# Patient Record
Sex: Male | Born: 1970
Health system: Southern US, Community
[De-identification: ages and names within clinical notes are randomized; demographics above are authoritative.]

---

## 1997-07-11 ENCOUNTER — Ambulatory Visit (HOSPITAL_COMMUNITY): Admission: RE | Admit: 1997-07-11 | Discharge: 1997-07-11 | Payer: Self-pay | Admitting: Family Medicine

## 1997-07-27 ENCOUNTER — Ambulatory Visit (HOSPITAL_COMMUNITY): Admission: RE | Admit: 1997-07-27 | Discharge: 1997-07-27 | Payer: Self-pay | Admitting: Family Medicine

## 1997-12-16 ENCOUNTER — Ambulatory Visit (HOSPITAL_COMMUNITY): Admission: RE | Admit: 1997-12-16 | Discharge: 1997-12-16 | Payer: Self-pay | Admitting: Family Medicine

## 1997-12-16 ENCOUNTER — Encounter: Payer: Self-pay | Admitting: Family Medicine

## 1999-02-24 ENCOUNTER — Emergency Department (HOSPITAL_COMMUNITY): Admission: EM | Admit: 1999-02-24 | Discharge: 1999-02-24 | Payer: Self-pay | Admitting: Emergency Medicine

## 1999-02-24 ENCOUNTER — Encounter: Payer: Self-pay | Admitting: Emergency Medicine

## 2000-07-24 ENCOUNTER — Emergency Department (HOSPITAL_COMMUNITY): Admission: EM | Admit: 2000-07-24 | Discharge: 2000-07-24 | Payer: Self-pay | Admitting: Emergency Medicine

## 2007-01-30 ENCOUNTER — Encounter: Admission: RE | Admit: 2007-01-30 | Discharge: 2007-04-30 | Payer: Self-pay | Admitting: Family Medicine

## 2015-10-03 ENCOUNTER — Encounter (HOSPITAL_COMMUNITY): Payer: Self-pay | Admitting: *Deleted

## 2015-10-03 DIAGNOSIS — R071 Chest pain on breathing: Secondary | ICD-10-CM | POA: Diagnosis present

## 2015-10-03 DIAGNOSIS — R0789 Other chest pain: Secondary | ICD-10-CM | POA: Diagnosis not present

## 2015-10-03 NOTE — ED Triage Notes (Signed)
Pt c/o sharp right side non radiating chest pain for two day. Pt states pt was watching TV when pain started, any activity increases pain. Denies any other symptoms.

## 2015-10-04 ENCOUNTER — Emergency Department (HOSPITAL_COMMUNITY): Payer: BLUE CROSS/BLUE SHIELD

## 2015-10-04 ENCOUNTER — Emergency Department (HOSPITAL_COMMUNITY)
Admission: EM | Admit: 2015-10-04 | Discharge: 2015-10-04 | Disposition: A | Discharge status: Home / Self Care | Payer: BLUE CROSS/BLUE SHIELD | Attending: Emergency Medicine | Admitting: Emergency Medicine

## 2015-10-04 DIAGNOSIS — R079 Chest pain, unspecified: Secondary | ICD-10-CM

## 2015-10-04 LAB — BASIC METABOLIC PANEL
ANION GAP: 8 (ref 5–15)
BUN: 9 mg/dL (ref 6–20)
CO2: 26 mmol/L (ref 22–32)
CREATININE: 1.11 mg/dL (ref 0.61–1.24)
Calcium: 9.6 mg/dL (ref 8.9–10.3)
Chloride: 104 mmol/L (ref 101–111)
GFR calc Af Amer: >60 mL/min (ref >60)
GFR calc non Af Amer: >60 mL/min (ref >60)
GLUCOSE: 131 mg/dL — AB (ref 65–99)
POTASSIUM: 3.9 mmol/L (ref 3.5–5.1)
Sodium: 138 mmol/L (ref 135–145)

## 2015-10-04 LAB — CBC
HEMATOCRIT: 45.2 % (ref 39.0–52.0)
Hemoglobin: 15.4 g/dL (ref 13.0–17.0)
MCH: 29.1 pg (ref 26.0–34.0)
MCHC: 34.1 g/dL (ref 30.0–36.0)
MCV: 85.4 fL (ref 78.0–100.0)
PLATELETS: 266 10*3/uL (ref 150–400)
RBC: 5.29 MIL/uL (ref 4.22–5.81)
RDW: 13.1 % (ref 11.5–15.5)
WBC: 8 10*3/uL (ref 4.0–10.5)

## 2015-10-04 LAB — I-STAT TROPONIN, ED: Troponin i, poc: 0 ng/mL (ref 0.00–0.08)

## 2015-10-04 NOTE — ED Notes (Addendum)
Pt reports left sided chest pain that he describes as "under my right breast."  Pt reports that the pain has been constant for 2 days. Pt reports increased pain with inspiration and coughing.  Pt rates his chest pain at a 4/10 at this time.   Chief Complaint  Patient presents with  . Chest Pain   History reviewed. No pertinent past medical history.

## 2015-10-04 NOTE — Discharge Instructions (Signed)
Ibuprofen 600 mg every 6 hours as needed for pain.  Follow-up with cardiology if your symptoms are not improving in the next 2 days. [facility] have been provided in this discharge summary for you to call and make these arrangements.  Return to the emergency department if your symptoms significantly worsen or change.

## 2015-10-04 NOTE — ED Notes (Signed)
Pt provided with d/c instructions at this time. Pt verbalizes understanding of d/c instructions as well as follow up procedure after d/c. No new RX at time of d/c.  Pt in no apparent distress at this time. Pt ambulatory at time of d/c.   

## 2015-10-04 NOTE — ED Provider Notes (Signed)
[facility]-EMERGENCY DEPT Provider Note   CSN: [id number] Arrival date & time: 10/03/15  2341  By signing my name below, I, Clovis Pu, attest that this documentation has been prepared under the direction and in the presence of Geoffery Lyons, MD  Electronically Signed: Clovis Pu, ED Scribe. 10/04/15. 3:33 AM.  History   Chief Complaint Chief Complaint  Patient presents with  . Chest Pain    The history is provided by the patient. No language interpreter was used.  Chest Pain   This is a new problem. The current episode started more than 2 days ago. The problem occurs constantly. The problem has not changed since onset.The pain is associated with movement and coughing. The pain is moderate. The symptoms are aggravated by deep breathing. Pertinent negatives include no shortness of breath. He has tried nothing for the symptoms.   HPI Comments:  Marco Miller is a 45 y.o. male who presents to the Emergency Department complaining of sudden onset , constant right side chest pain which began 2 days ago. Pt notes he was sitting and watching tv when the pain suddenly began. He states the pain is exacerbated when he lays down, upon palpation, when he coughs, sneezes, or takes deep breaths. He denies SOB, any recent strenuous activity, swelling to his BLE, DM, heart problems, or smoking. Pt notes he is active but has not worked out since August. No alleviating factors noted.    History reviewed. No pertinent past medical history.  There are no active problems to display for this patient.   History reviewed. No pertinent surgical history.   Home Medications    Prior to Admission medications   Medication Sig Start Date End Date Taking? Authorizing Provider  fluticasone (FLONASE) 50 MCG/ACT nasal spray Place 1 spray into both nostrils daily as needed for allergies or rhinitis.   Yes Historical Provider, MD  valACYclovir (VALTREX) 500 MG tablet Take 500 mg by mouth 2 (two) times daily as needed  (outbreak).   Yes Historical Provider, MD    Family History History reviewed. No pertinent family history.  Social History Social History  Substance Use Topics  . Smoking status: Never Smoker  . Smokeless tobacco: Never Used  . Alcohol use Yes     Allergies   Review of patient's allergies indicates no known allergies.   Review of Systems Review of Systems  Respiratory: Negative for shortness of breath.   Cardiovascular: Positive for chest pain.  All other systems reviewed and are negative.   Physical Exam Updated Vital Signs BP 118/80   Pulse 63   Temp 97.8 F (36.6 C) (Oral)   Resp 17   Ht 6' (1.829 m)   Wt 215 lb (97.5 kg)   SpO2 100%   BMI 29.16 kg/m   Physical Exam  Constitutional: He is oriented to person, place, and time. He appears well-developed and well-nourished. No distress.  HENT:  Head: Normocephalic and atraumatic.  Right Ear: Hearing normal.  Left Ear: Hearing normal.  Nose: Nose normal.  Mouth/Throat: Oropharynx is clear and moist and mucous membranes are normal.  Eyes: Conjunctivae and EOM are normal. Pupils are equal, round, and reactive to light.  Neck: Normal range of motion. Neck supple.  Cardiovascular: Regular rhythm, S1 normal and S2 normal.  Exam reveals no gallop and no friction rub.   No murmur heard. Pulmonary/Chest: Effort normal and breath sounds normal. No respiratory distress.  TTP over right lower ribs.   Abdominal: Soft. Normal appearance and bowel sounds  are normal. There is no hepatosplenomegaly. There is no tenderness. There is no rebound, no guarding, no tenderness at McBurney's point and negative Murphy's sign. No hernia.  Musculoskeletal: Normal range of motion.  Neurological: He is alert and oriented to person, place, and time. He has normal strength. No cranial nerve deficit or sensory deficit. Coordination normal. GCS eye subscore is 4. GCS verbal subscore is 5. GCS motor subscore is 6.  Skin: Skin is warm, dry and  intact. No rash noted. No cyanosis.  Psychiatric: He has a normal mood and affect. His speech is normal and behavior is normal. Thought content normal.  Nursing note and vitals reviewed.   ED Treatments / Results  DIAGNOSTIC STUDIES:  Oxygen Saturation is 100% on RA, normal by my interpretation.    COORDINATION OF CARE:  3:27 AM Discussed treatment plan with pt at bedside and pt agreed to plan.  Labs (all labs ordered are listed, but only abnormal results are displayed) Labs Reviewed  BASIC METABOLIC PANEL - Abnormal; Notable for the following:       Result Value   Glucose, Bld 131 (*)    All other components within normal limits  CBC  I-STAT TROPOININ, ED    EKG  EKG Interpretation None       Radiology Dg Chest 2 View  Result Date: 10/04/2015 CLINICAL DATA:  Right-sided chest pain for 2 days. Worse with cough. EXAM: CHEST  2 VIEW COMPARISON:  None. FINDINGS: Slightly shallow inspiration. The heart size and mediastinal contours are within normal limits. Both lungs are clear. The visualized skeletal structures are unremarkable. IMPRESSION: No active cardiopulmonary disease. Electronically Signed   By: Burman NievesWilliam  Stevens M.D.   On: 10/04/2015 00:18    Procedures Procedures (including critical care time)  Medications Ordered in ED Medications - No data to display   Initial Impression / Assessment and Plan / ED Course  I have reviewed the triage vital signs and the nursing notes.  Pertinent labs & imaging results that were available during my care of the patient were reviewed by me and considered in my medical decision making (see chart for details).  Clinical Course    Patient presents with a three-day history of chest discomfort that began in the absence of any injury or trauma. There is no associated shortness of breath, nausea, diaphoresis, or radiation. He has no exertional symptoms. His workup reveals no evidence for a cardiac etiology. Troponin is negative,  however EKG does show nonspecific ST segment abnormalities. He has no prior ECGs for comparison. His discomfort is very reproducible with palpation of the right lower anterior chest wall and I highly suspect a musculoskeletal etiology. He will be discharged with anti-inflammatories and when necessary return.  Final Clinical Impressions(s) / ED Diagnoses   Final diagnoses:  None    New Prescriptions New Prescriptions   No medications on file  I personally performed the services described in this documentation, which was scribed in my presence. The recorded information has been reviewed and is accurate.        Geoffery Lyonsouglas Rhett Najera, MD 10/04/15 [phone]

## 2017-10-15 IMAGING — DX DG CHEST 2V
2 series · 2 of 2 positions shown · non-contrast
Comparison: None.

CLINICAL DATA: Right-sided chest pain for 2 days. Worse with cough.

EXAM:
CHEST  2 VIEW

[chest pa]
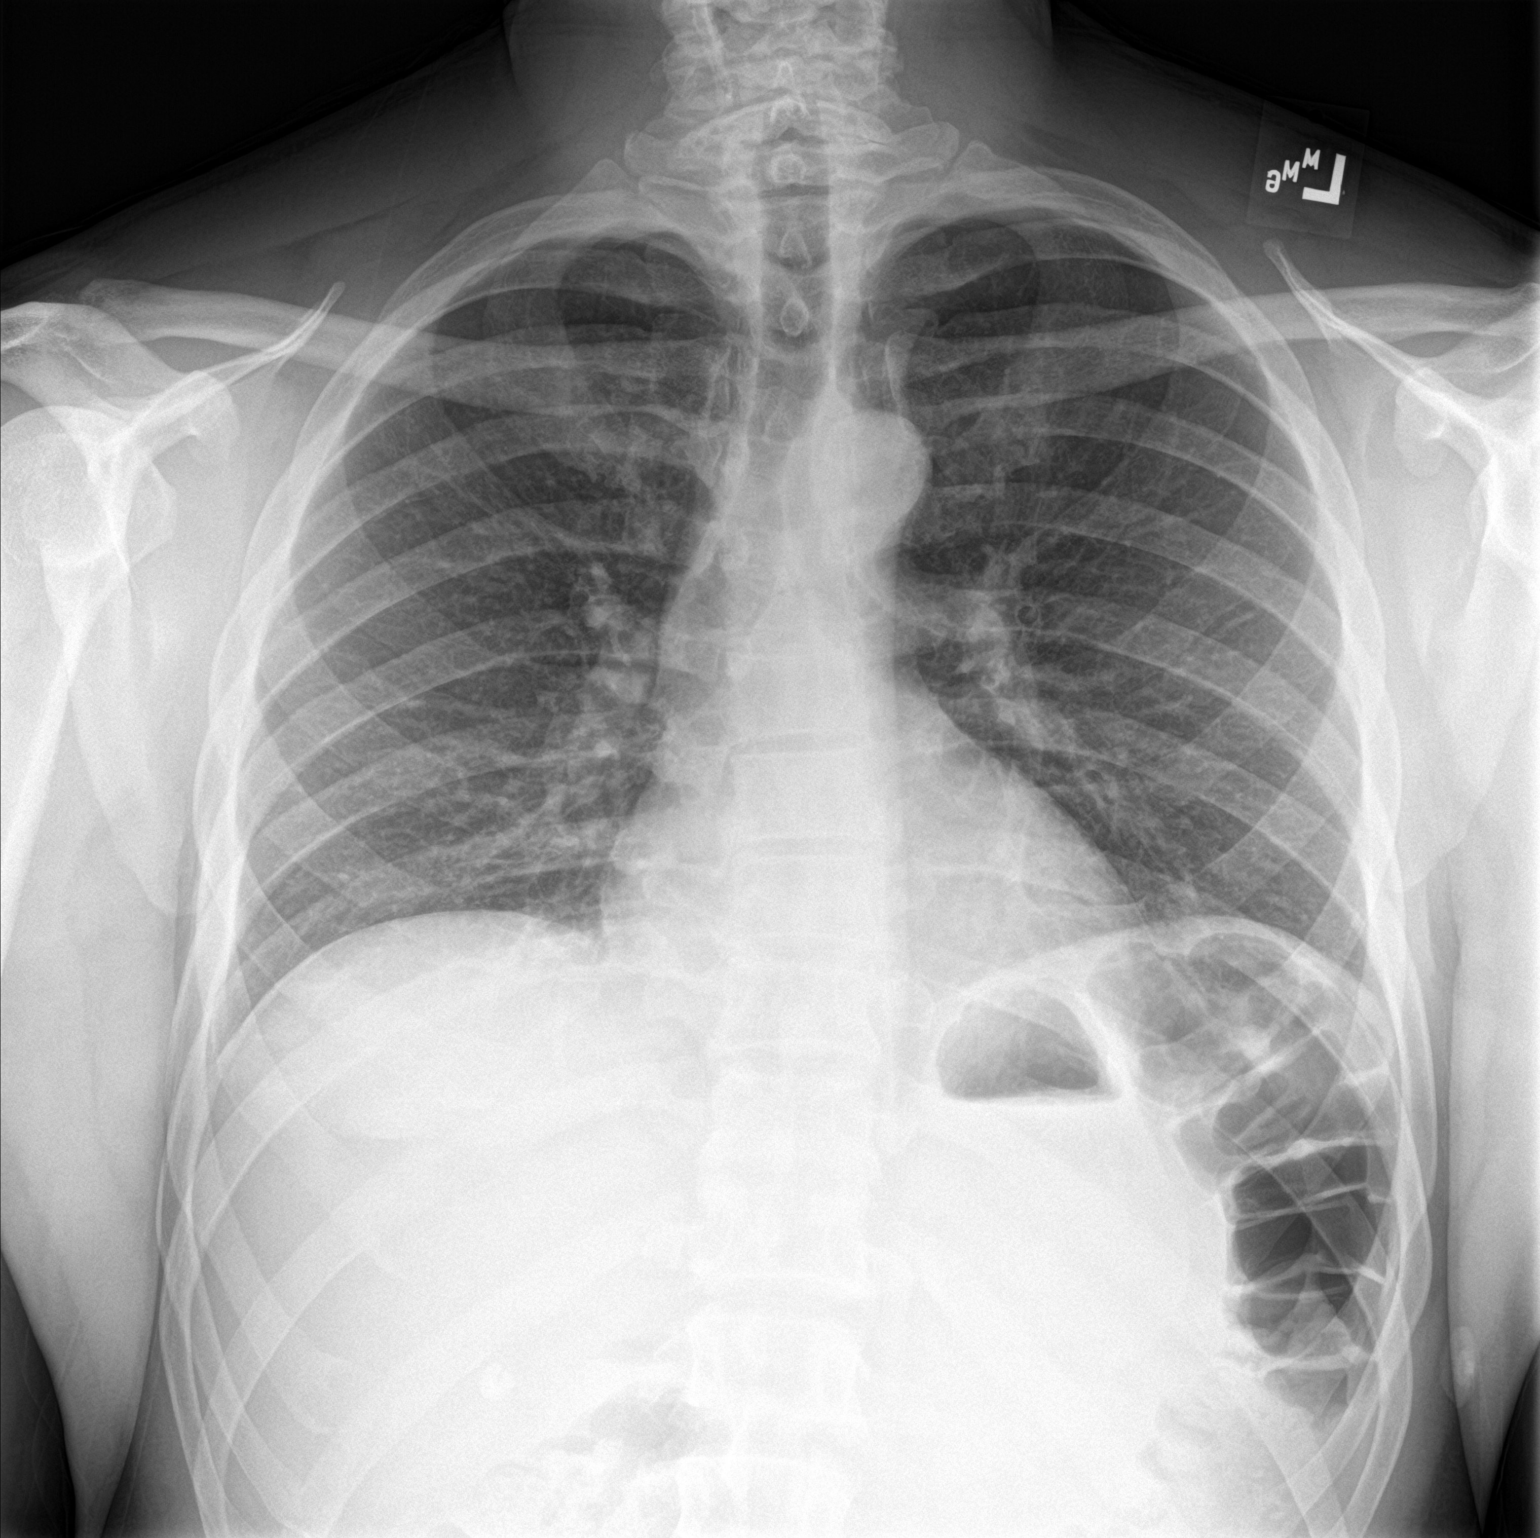

[chest lat]
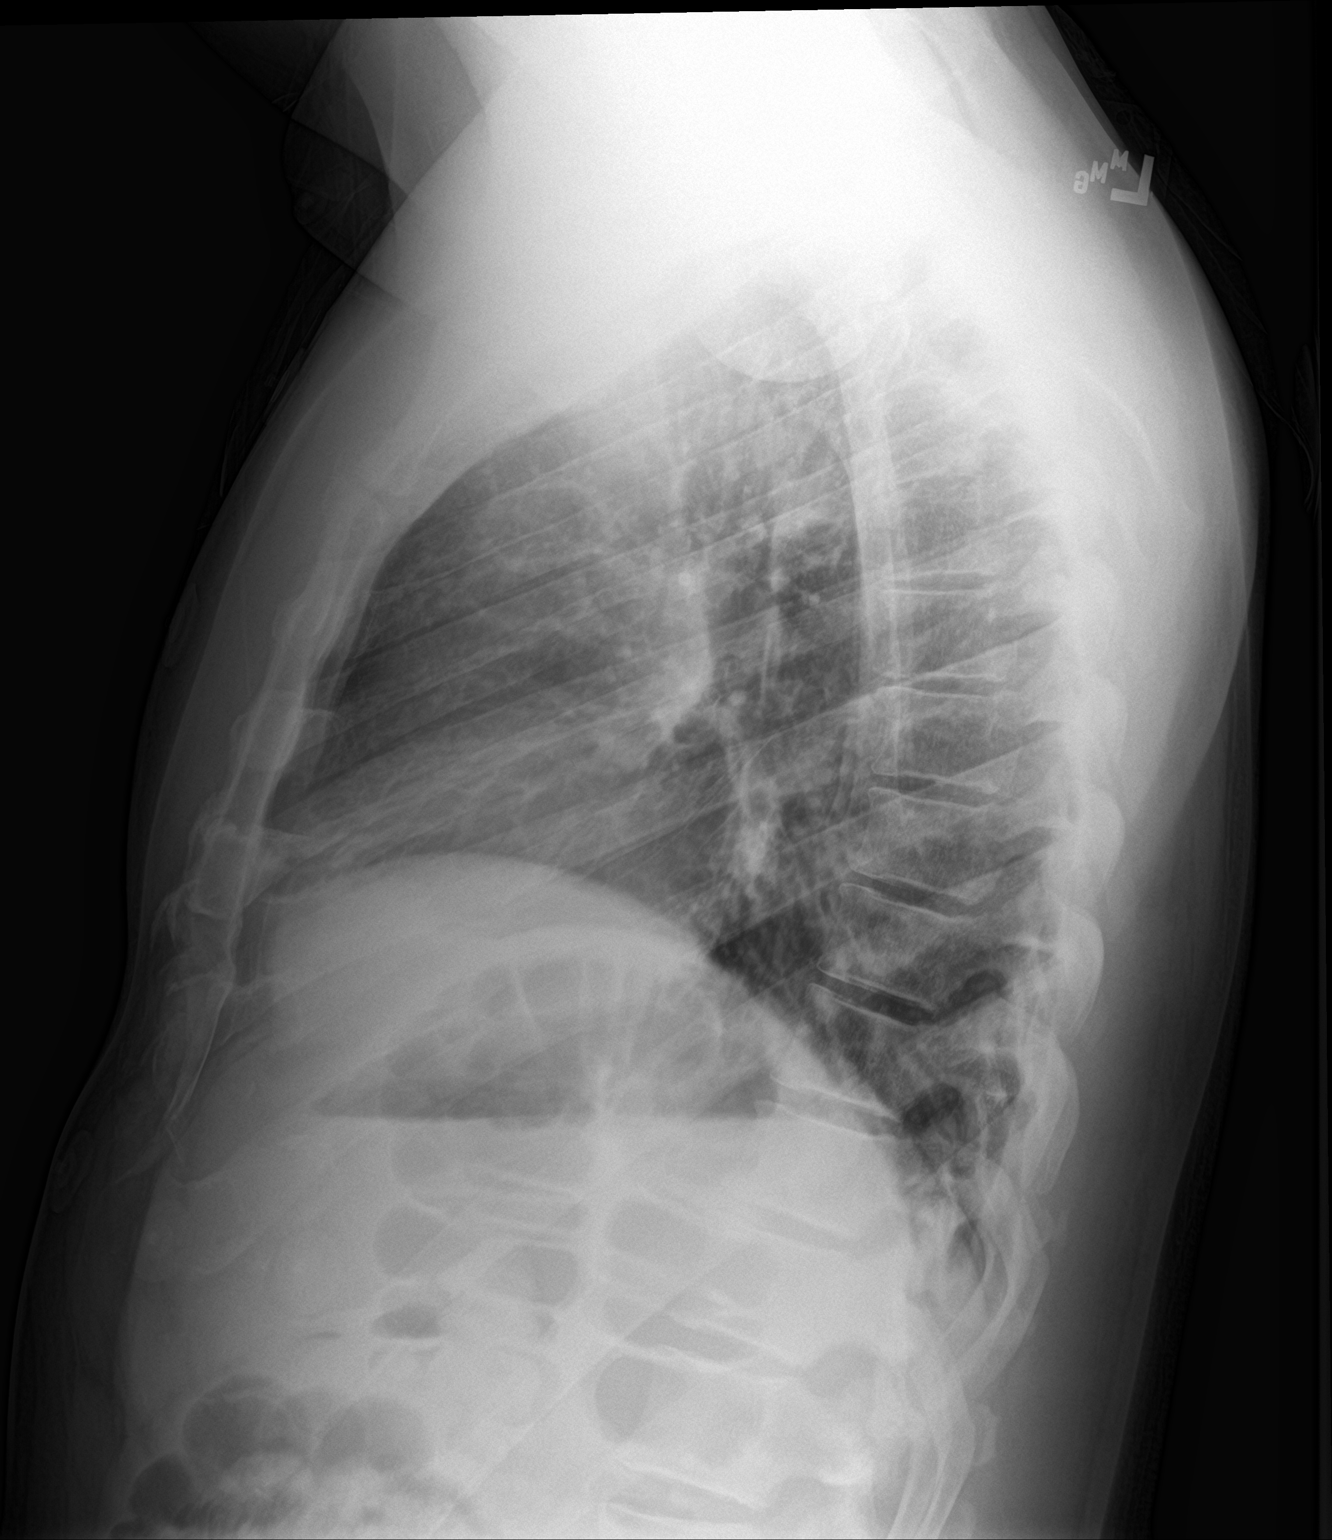

[2 of 2 positions shown; findings below may reference images not displayed]

FINDINGS: Slightly shallow inspiration. The heart size and mediastinal
contours are within normal limits. Both lungs are clear. The
visualized skeletal structures are unremarkable.
IMPRESSION: No active cardiopulmonary disease.

## 2018-04-06 DIAGNOSIS — B009 Herpesviral infection, unspecified: Secondary | ICD-10-CM | POA: Diagnosis not present

## 2018-04-06 DIAGNOSIS — J029 Acute pharyngitis, unspecified: Secondary | ICD-10-CM | POA: Diagnosis not present

## 2018-09-29 DIAGNOSIS — A7489 Other chlamydial diseases: Secondary | ICD-10-CM | POA: Diagnosis not present

## 2018-10-30 DIAGNOSIS — R361 Hematospermia: Secondary | ICD-10-CM | POA: Diagnosis not present

## 2018-10-30 DIAGNOSIS — Z125 Encounter for screening for malignant neoplasm of prostate: Secondary | ICD-10-CM | POA: Diagnosis not present

## 2018-11-01 DIAGNOSIS — Z20828 Contact with and (suspected) exposure to other viral communicable diseases: Secondary | ICD-10-CM | POA: Diagnosis not present

## 2018-11-05 DIAGNOSIS — U071 COVID-19: Secondary | ICD-10-CM | POA: Diagnosis not present

## 2018-11-11 ENCOUNTER — Other Ambulatory Visit: Payer: Self-pay

## 2018-11-11 DIAGNOSIS — Z20822 Contact with and (suspected) exposure to covid-19: Secondary | ICD-10-CM

## 2018-11-13 LAB — NOVEL CORONAVIRUS, NAA

## 2018-11-16 ENCOUNTER — Other Ambulatory Visit: Payer: Self-pay

## 2018-11-16 DIAGNOSIS — Z20822 Contact with and (suspected) exposure to covid-19: Secondary | ICD-10-CM

## 2018-11-17 LAB — NOVEL CORONAVIRUS, NAA: SARS-CoV-2, NAA: NOT DETECTED

## 2019-10-05 DIAGNOSIS — Z113 Encounter for screening for infections with a predominantly sexual mode of transmission: Secondary | ICD-10-CM | POA: Diagnosis not present

## 2020-02-04 DIAGNOSIS — Z20822 Contact with and (suspected) exposure to covid-19: Secondary | ICD-10-CM | POA: Diagnosis not present

## 2020-04-20 DIAGNOSIS — B009 Herpesviral infection, unspecified: Secondary | ICD-10-CM | POA: Diagnosis not present

## 2021-05-03 DIAGNOSIS — Z0000 Encounter for general adult medical examination without abnormal findings: Secondary | ICD-10-CM | POA: Diagnosis not present

## 2021-05-03 DIAGNOSIS — E7800 Pure hypercholesterolemia, unspecified: Secondary | ICD-10-CM | POA: Diagnosis not present

## 2021-05-18 DIAGNOSIS — M205X1 Other deformities of toe(s) (acquired), right foot: Secondary | ICD-10-CM | POA: Diagnosis not present

## 2021-05-18 DIAGNOSIS — M792 Neuralgia and neuritis, unspecified: Secondary | ICD-10-CM | POA: Diagnosis not present

## 2021-05-18 DIAGNOSIS — M7751 Other enthesopathy of right foot: Secondary | ICD-10-CM | POA: Diagnosis not present

## 2022-01-30 DIAGNOSIS — Z1329 Encounter for screening for other suspected endocrine disorder: Secondary | ICD-10-CM | POA: Diagnosis not present

## 2022-01-30 DIAGNOSIS — E291 Testicular hypofunction: Secondary | ICD-10-CM | POA: Diagnosis not present

## 2022-01-30 DIAGNOSIS — Z125 Encounter for screening for malignant neoplasm of prostate: Secondary | ICD-10-CM | POA: Diagnosis not present

## 2022-02-04 DIAGNOSIS — M2550 Pain in unspecified joint: Secondary | ICD-10-CM | POA: Diagnosis not present

## 2022-02-04 DIAGNOSIS — Z6832 Body mass index (BMI) 32.0-32.9, adult: Secondary | ICD-10-CM | POA: Diagnosis not present

## 2022-02-04 DIAGNOSIS — E291 Testicular hypofunction: Secondary | ICD-10-CM | POA: Diagnosis not present

## 2022-02-04 DIAGNOSIS — N529 Male erectile dysfunction, unspecified: Secondary | ICD-10-CM | POA: Diagnosis not present

## 2022-02-11 DIAGNOSIS — E291 Testicular hypofunction: Secondary | ICD-10-CM | POA: Diagnosis not present

## 2022-02-19 DIAGNOSIS — E291 Testicular hypofunction: Secondary | ICD-10-CM | POA: Diagnosis not present

## 2022-02-26 DIAGNOSIS — E291 Testicular hypofunction: Secondary | ICD-10-CM | POA: Diagnosis not present

## 2022-03-05 DIAGNOSIS — E291 Testicular hypofunction: Secondary | ICD-10-CM | POA: Diagnosis not present

## 2022-03-05 DIAGNOSIS — Z79890 Hormone replacement therapy (postmenopausal): Secondary | ICD-10-CM | POA: Diagnosis not present

## 2022-03-07 DIAGNOSIS — E291 Testicular hypofunction: Secondary | ICD-10-CM | POA: Diagnosis not present

## 2022-03-07 DIAGNOSIS — Z6832 Body mass index (BMI) 32.0-32.9, adult: Secondary | ICD-10-CM | POA: Diagnosis not present

## 2022-03-07 DIAGNOSIS — M2550 Pain in unspecified joint: Secondary | ICD-10-CM | POA: Diagnosis not present

## 2022-03-07 DIAGNOSIS — N529 Male erectile dysfunction, unspecified: Secondary | ICD-10-CM | POA: Diagnosis not present

## 2022-03-14 DIAGNOSIS — E291 Testicular hypofunction: Secondary | ICD-10-CM | POA: Diagnosis not present

## 2022-03-21 DIAGNOSIS — Z6832 Body mass index (BMI) 32.0-32.9, adult: Secondary | ICD-10-CM | POA: Diagnosis not present

## 2022-03-21 DIAGNOSIS — E291 Testicular hypofunction: Secondary | ICD-10-CM | POA: Diagnosis not present

## 2022-03-28 DIAGNOSIS — E291 Testicular hypofunction: Secondary | ICD-10-CM | POA: Diagnosis not present

## 2022-03-28 DIAGNOSIS — Z6833 Body mass index (BMI) 33.0-33.9, adult: Secondary | ICD-10-CM | POA: Diagnosis not present

## 2022-04-12 DIAGNOSIS — E291 Testicular hypofunction: Secondary | ICD-10-CM | POA: Diagnosis not present

## 2022-04-19 DIAGNOSIS — E291 Testicular hypofunction: Secondary | ICD-10-CM | POA: Diagnosis not present

## 2022-04-26 DIAGNOSIS — E291 Testicular hypofunction: Secondary | ICD-10-CM | POA: Diagnosis not present

## 2022-05-03 DIAGNOSIS — E291 Testicular hypofunction: Secondary | ICD-10-CM | POA: Diagnosis not present

## 2022-05-07 DIAGNOSIS — Z0000 Encounter for general adult medical examination without abnormal findings: Secondary | ICD-10-CM | POA: Diagnosis not present

## 2022-05-07 DIAGNOSIS — N529 Male erectile dysfunction, unspecified: Secondary | ICD-10-CM | POA: Diagnosis not present

## 2022-05-07 DIAGNOSIS — E7800 Pure hypercholesterolemia, unspecified: Secondary | ICD-10-CM | POA: Diagnosis not present

## 2022-05-07 DIAGNOSIS — Z125 Encounter for screening for malignant neoplasm of prostate: Secondary | ICD-10-CM | POA: Diagnosis not present

## 2022-05-07 DIAGNOSIS — B009 Herpesviral infection, unspecified: Secondary | ICD-10-CM | POA: Diagnosis not present

## 2022-05-10 DIAGNOSIS — E291 Testicular hypofunction: Secondary | ICD-10-CM | POA: Diagnosis not present

## 2022-05-10 DIAGNOSIS — Z6833 Body mass index (BMI) 33.0-33.9, adult: Secondary | ICD-10-CM | POA: Diagnosis not present

## 2022-05-17 DIAGNOSIS — E291 Testicular hypofunction: Secondary | ICD-10-CM | POA: Diagnosis not present

## 2022-06-04 DIAGNOSIS — Z79890 Hormone replacement therapy (postmenopausal): Secondary | ICD-10-CM | POA: Diagnosis not present

## 2022-06-04 DIAGNOSIS — E291 Testicular hypofunction: Secondary | ICD-10-CM | POA: Diagnosis not present

## 2022-06-06 DIAGNOSIS — M2550 Pain in unspecified joint: Secondary | ICD-10-CM | POA: Diagnosis not present

## 2022-06-06 DIAGNOSIS — E291 Testicular hypofunction: Secondary | ICD-10-CM | POA: Diagnosis not present

## 2022-06-06 DIAGNOSIS — N529 Male erectile dysfunction, unspecified: Secondary | ICD-10-CM | POA: Diagnosis not present

## 2022-06-06 DIAGNOSIS — R6882 Decreased libido: Secondary | ICD-10-CM | POA: Diagnosis not present

## 2022-06-21 DIAGNOSIS — E291 Testicular hypofunction: Secondary | ICD-10-CM | POA: Diagnosis not present

## 2022-06-28 DIAGNOSIS — E291 Testicular hypofunction: Secondary | ICD-10-CM | POA: Diagnosis not present

## 2022-06-28 DIAGNOSIS — Z6832 Body mass index (BMI) 32.0-32.9, adult: Secondary | ICD-10-CM | POA: Diagnosis not present

## 2022-07-17 DIAGNOSIS — E291 Testicular hypofunction: Secondary | ICD-10-CM | POA: Diagnosis not present

## 2022-07-25 DIAGNOSIS — E291 Testicular hypofunction: Secondary | ICD-10-CM | POA: Diagnosis not present

## 2022-08-01 DIAGNOSIS — E291 Testicular hypofunction: Secondary | ICD-10-CM | POA: Diagnosis not present

## 2023-04-28 ENCOUNTER — Other Ambulatory Visit: Payer: Self-pay | Admitting: Medical Genetics

## 2023-05-15 DIAGNOSIS — Z23 Encounter for immunization: Secondary | ICD-10-CM | POA: Diagnosis not present

## 2023-05-15 DIAGNOSIS — B009 Herpesviral infection, unspecified: Secondary | ICD-10-CM | POA: Diagnosis not present

## 2023-05-15 DIAGNOSIS — M79674 Pain in right toe(s): Secondary | ICD-10-CM | POA: Diagnosis not present

## 2023-05-15 DIAGNOSIS — E7800 Pure hypercholesterolemia, unspecified: Secondary | ICD-10-CM | POA: Diagnosis not present

## 2023-05-15 DIAGNOSIS — Z0000 Encounter for general adult medical examination without abnormal findings: Secondary | ICD-10-CM | POA: Diagnosis not present

## 2023-05-15 DIAGNOSIS — Z125 Encounter for screening for malignant neoplasm of prostate: Secondary | ICD-10-CM | POA: Diagnosis not present

## 2023-05-21 DIAGNOSIS — Z1211 Encounter for screening for malignant neoplasm of colon: Secondary | ICD-10-CM | POA: Diagnosis not present

## 2023-05-21 DIAGNOSIS — D123 Benign neoplasm of transverse colon: Secondary | ICD-10-CM | POA: Diagnosis not present

## 2023-06-16 DIAGNOSIS — M792 Neuralgia and neuritis, unspecified: Secondary | ICD-10-CM | POA: Diagnosis not present

## 2023-06-16 DIAGNOSIS — M205X1 Other deformities of toe(s) (acquired), right foot: Secondary | ICD-10-CM | POA: Diagnosis not present

## 2023-11-04 ENCOUNTER — Other Ambulatory Visit: Payer: Self-pay | Admitting: Medical Genetics

## 2023-11-04 DIAGNOSIS — Z006 Encounter for examination for normal comparison and control in clinical research program: Secondary | ICD-10-CM
